# Patient Record
Sex: Male | Born: 1995 | Hispanic: Yes | Marital: Single | Smoking: Never smoker
Health system: Southern US, Community
[De-identification: ages and names within clinical notes are randomized; demographics above are authoritative.]

## PROBLEM LIST (undated history)

## (undated) DIAGNOSIS — S59222A Salter-Harris Type II physeal fracture of lower end of radius, left arm, initial encounter for closed fracture: Secondary | ICD-10-CM

## (undated) HISTORY — DX: Salter-Harris type II physeal fracture of lower end of radius, left arm, initial encounter for closed fracture: S59.222A

---
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
## 2017-02-01 ENCOUNTER — Telehealth: Payer: Self-pay | Admitting: Sports Medicine

## 2017-02-01 DIAGNOSIS — R69 Illness, unspecified: Principal | ICD-10-CM

## 2017-02-01 DIAGNOSIS — J111 Influenza due to unidentified influenza virus with other respiratory manifestations: Secondary | ICD-10-CM | POA: Insufficient documentation

## 2017-02-01 MED ORDER — THERAFLU SEVERE COLD/CGH DAY 20-10-650 MG PO PACK: PACK | ORAL | 0 refills | Status: DC

## 2017-02-01 MED ORDER — OSELTAMIVIR PHOSPHATE 75 MG PO CAPS
75.0000 mg | ORAL_CAPSULE | Freq: Two times a day (BID) | ORAL | 0 refills | Status: DC
Start: 2017-02-01 — End: 2017-04-24

## 2017-02-01 NOTE — Telephone Encounter (Signed)
Patient seen with his brother, called in medication and work note.

## 2017-04-24 ENCOUNTER — Ambulatory Visit (INDEPENDENT_AMBULATORY_CARE_PROVIDER_SITE_OTHER): Payer: Self-pay | Admitting: Physician Assistant

## 2017-04-24 VITALS — BP 128/75 | HR 82 | Temp 98.1°F | Wt 235.0 lb

## 2017-04-24 DIAGNOSIS — Z23 Encounter for immunization: Secondary | ICD-10-CM

## 2017-04-24 DIAGNOSIS — S91331A Puncture wound without foreign body, right foot, initial encounter: Secondary | ICD-10-CM

## 2017-04-24 NOTE — Progress Notes (Signed)
HPI:                                                                Norman Green is a 22 y.o. male who presents to [place]: [place] today for puncture wound  Foot Injury   The incident occurred 12 to 24 hours ago. The incident occurred in the yard. Injury mechanism: stepped on a nail while wearing a shoe. The pain is present in the right foot. The quality of the pain is described as aching. The pain is moderate. Pertinent negatives include no inability to bear weight or loss of motion. He reports no foreign bodies present. The symptoms are aggravated by weight bearing. He has tried nothing for the symptoms.       Past Medical History:  Diagnosis Date  . Closed Salter-Harris Type II physeal fracture of left distal radius 09/08/2012   No past surgical history on file. Social History  Substance Use Topics  . Smoking status: Never Smoker  . Smokeless tobacco: Never Used  . Alcohol use No   family history is not on file.  ROS: negative except as noted in the HPI  Medications: No current outpatient prescriptions on file.   No current facility-administered medications for this visit.    No Known Allergies     Objective:  BP 128/75   Pulse 82   Temp 98.1 F (36.7 C) (Oral)   Wt 235 lb (106.6 kg)   BMI 34.70 kg/m  Gen: well-groomed, cooperative, not ill-appearing, no distress Pulm: Normal work of breathing, normal phonation Neuro: alert and oriented x 3 MSK: moving all extremities, normal gait and station, no peripheral edema Skin: warm and dry, approx. 32mm wide, 87mm deep puncture wound on center of right heel, no surrounding erythema or induration, no active bleeding or drainage, no ecchymoses   No results found for this or any previous visit (from the past 72 hour(s)). No results found.    Assessment and Plan: 21 y.o. male with   1. Puncture wound of right heel, initial encounter - Tdap vaccine given - instructed to  monitor for signs of infection - Ibuprofen prn - corn cushion  Patient education and anticipatory guidance given Patient agrees with treatment plan Follow-up as needed if symptoms worsen or fail to improve  Darlyne Russian PA-C

## 2017-04-24 NOTE — Patient Instructions (Addendum)
- Ibuprofen 600mg  every 8 hours as needed for pain - Corn cushion around the puncture wound to take pressure off of it when you walk - keep area clean and dry  - monitor for signs of infection (redness, thick drainage, worsening pain)   Herida punzante (Puncture Wound) Una herida punzante es una lesin causada por un objeto fino y filoso que penetra en la piel, por ejemplo, un clavo. Este tipo de heridas no suelen dejar una gran abertura en la piel, de modo que es posible que no sangren [place]. Sin embargo, cuando se produce una herida punzante, la suciedad u otros materiales (cuerpos extraos) pueden Advertising account planner ella y Cabin crew. Esto aumenta la probabilidad de una infeccin, por ejemplo, ttanos. CAUSAS Las heridas punzantes son causadas por cualquier objeto fino y filoso que penetra en la piel, por ejemplo:  Dientes de [place], como en el caso de [place].  49 filosos y puntiagudos, como [place], [place] de vidrio, anzuelos y [place]. SNTOMAS Los sntomas de una herida punzante incluyen lo siguiente:  Social research officer, government.  Hemorragia.  Hinchazn.  Hematomas.  Prdida de lquido de la herida.  Adormecimiento, hormigueo o prdida de la funcionalidad. DIAGNSTICO Esta afeccin se diagnostica mediante la historia clnica y un examen fsico. Se examinar la herida para detectar la presencia de cuerpos extraos. Tambin se pueden hacer radiografas y otros estudios de diagnstico por imgenes. TRATAMIENTO El tratamiento de una herida punzante depende de su gravedad y, tambin, de si hay en ella cuerpos extraos. Por lo general, el tratamiento de todos los tipos de heridas punzantes comienza de la siguiente manera:  Control de la hemorragia.  Lavado de la herida con una solucin de agua y sal libre de microbios (estril).  Control de herida para detectar la presencia de cuerpos extraos. El tratamiento tambin puede incluir lo siguiente:  Abrir quirrgicamente la herida para  extraer un cuerpo extrao.  Cerrar la herida con puntos (suturas), si la hemorragia no se detiene.  Cubrir la herida con ungentos con antibitico y Furniture conservator/restorer venda (vendaje).  Aplicarse la vacuna contra el ttanos.  Aplicarse la vacuna contra la rabia. [place] o aplquese los medicamentos de venta libre y recetados solamente como se lo haya indicado el mdico.  Si le recetaron un antibitico, tmelo o aplqueselo como se lo haya indicado el mdico. No deje de usar el antibitico aunque la afeccin mejore. Cuidados de la herida  Existen muchas maneras de cerrar y [place] herida. Por ejemplo, una herida se puede cubrir con suturas, pegamento para la piel o tiras [id number].Siga las indicaciones del mdico acerca de lo siguiente:  Cmo cuidar de la herida.  Cundo y cmo cambiar el vendaje.  Cundo retirar el vendaje.  Cmo quitar lo que se haya utilizado para cerrar la herida.  Mantenga el vendaje seco, como se lo haya indicado el mdico. No tome baos de inmersin, no nade, no use el jacuzzi ni haga ninguna actividad en la que la herida quede debajo del agua hasta que el mdico lo autorice.  Limpie la herida como se lo haya indicado el mdico.  No se rasque ni se toque la herida.  Controle la herida [employer] para detectar signos de infeccin. Est atento a lo siguiente:  Dolor, hinchazn o enrojecimiento.  Lquido, sangre o pus. Instrucciones generales  Cuando est sentado o acostado, eleve la zona de la lesin por encima del nivel del corazn.  Si la herida punzante est  en el pie, pregntele al mdico si debe evitar soportar peso sobre este y durante cunto [place].  Concurra a todas las visitas de control como se lo haya indicado el mdico. Esto es importante. SOLICITE ATENCIN MDICA SI:  Le aplicaron la antitetnica y tiene hinchazn, dolor intenso, enrojecimiento o hemorragia en el sitio de la  inyeccin.  Tiene fiebre.  Se le salen las suturas.  Percibe que sale mal olor de la herida o del vendaje.  Nota que hay un cuerpo extrao en la herida, como un trozo de [place] o vidrio.  El dolor no se alivia con los [employer].  Tiene ms enrojecimiento, hinchazn o dolor en el lugar de la herida.  Observa lquido, sangre o pus que salen de la herida.  Observa que la piel cerca de la herida cambia de color.  Debe cambiar el vendaje con frecuencia debido a que hay secrecin de lquido, sangre o pus que emana de la herida.  Aparece una nueva erupcin cutnea.  Tiene adormecimiento alrededor de la herida. SOLICITE ATENCIN MDICA DE INMEDIATO SI:  Tiene mucha hinchazn alrededor de la herida.  El dolor aumenta repentinamente y es intenso.  Le aparecen bultos dolorosos en la piel.  Tiene una lnea roja que sale de la herida.  La herida est en la mano o en el pie y no puede mover correctamente uno de los dedos.  La herida est en la mano o en el pie y [employer] dedos tienen un tono plido o [place]. Esta informacin no tiene Marine scientist el consejo del mdico. Asegrese de hacerle al mdico cualquier pregunta que tenga. Document Released: 09/19/2005 Document Revised: 08/31/2015 Document Reviewed: 02/02/2015 Elsevier Interactive Patient Education  2017 [employer].

## 2017-11-22 ENCOUNTER — Encounter: Payer: Self-pay | Admitting: Sports Medicine

## 2017-11-22 ENCOUNTER — Ambulatory Visit (INDEPENDENT_AMBULATORY_CARE_PROVIDER_SITE_OTHER): Payer: Self-pay | Admitting: Sports Medicine

## 2017-11-22 DIAGNOSIS — T2012XA Burn of first degree of lip(s), initial encounter: Secondary | ICD-10-CM

## 2017-11-22 DIAGNOSIS — T2002XA Burn of unspecified degree of lip(s), initial encounter: Secondary | ICD-10-CM | POA: Insufficient documentation

## 2017-11-22 DIAGNOSIS — B001 Herpesviral vesicular dermatitis: Secondary | ICD-10-CM

## 2017-11-22 MED ORDER — VALACYCLOVIR HCL 1 G PO TABS: 1000.0000 mg | ORAL_TABLET | Freq: Two times a day (BID) | ORAL | 2 refills | Status: DC

## 2017-11-22 MED ORDER — IBUPROFEN 800 MG PO TABS: 800.0000 mg | ORAL_TABLET | Freq: Three times a day (TID) | ORAL | 2 refills | Status: DC | PRN

## 2017-11-22 MED ORDER — MUPIROCIN 2 % EX OINT: TOPICAL_OINTMENT | CUTANEOUS | 3 refills | Status: DC

## 2017-11-22 NOTE — Assessment & Plan Note (Signed)
Adding Valtrex.

## 2017-11-22 NOTE — Progress Notes (Signed)
  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  He also then developed a cold sore in his bottom lip adjacent from the blister.  Symptoms are moderate, improving.  Localized without radiation.  Past medical history:  Negative.  See flowsheet/record as well for more information.  Surgical history: Negative.  See flowsheet/record as well for more information.  Family history: Negative.  See flowsheet/record as well for more information.  Social history: Negative.  See flowsheet/record as well for more information.  Allergies, and medications have been entered into the medical record, reviewed, and no changes needed.   Review of Systems: No fevers, chills, night sweats, weight loss, chest pain, or shortness of breath.   Objective:    General: Well Developed, well nourished, and in no acute distress.  Neuro: Alert and oriented x3, extra-ocular muscles intact, sensation grossly intact.  HEENT: Normocephalic, atraumatic, pupils equal round reactive to light, neck supple, no masses, no lymphadenopathy, thyroid nonpalpable.  Oropharynx, nasopharynx, ear canals unremarkable, lips are swollen, upper lip worse than the bottom lip, there is evidence of a superficial partial-thickness burn on the left side of his upper lip, no surrounding erythema, induration, no drainage.  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 Skin: Warm and dry, no rashes. Cardiac: Regular rate and rhythm, no murmurs rubs or gallops, no lower extremity edema.  Respiratory: Clear to auscultation bilaterally. Not using accessory muscles, speaking in full sentences.  Impression and Recommendations:    Cold sore Adding Valtrex.  Burn of lip Patient burned his lip about a week ago, he then  had a low-grade bacterial superinfection that seems to be resolving, adding topical mupirocin, ibuprofen 800. Return if no better in a week. ___________________________________________ Gwen Her. Dianah Field, M.D., ABFM., CAQSM. Primary Care and [place] Instructor of [place] of [facility] of Medicine

## 2017-11-22 NOTE — Assessment & Plan Note (Signed)
Patient burned his lip about a week ago, he then had a low-grade bacterial superinfection that seems to be resolving, adding topical mupirocin, ibuprofen 800. Return if no better in a week.

## 2019-01-27 ENCOUNTER — Ambulatory Visit (INDEPENDENT_AMBULATORY_CARE_PROVIDER_SITE_OTHER): Payer: Self-pay | Admitting: Physician Assistant

## 2019-01-27 ENCOUNTER — Encounter: Payer: Self-pay | Admitting: Physician Assistant

## 2019-01-27 VITALS — BP 142/85 | HR 74 | Temp 98.1°F | Wt 257.0 lb

## 2019-01-27 DIAGNOSIS — R030 Elevated blood-pressure reading, without diagnosis of hypertension: Secondary | ICD-10-CM | POA: Insufficient documentation

## 2019-01-27 DIAGNOSIS — D171 Benign lipomatous neoplasm of skin and subcutaneous tissue of trunk: Secondary | ICD-10-CM | POA: Insufficient documentation

## 2019-01-27 NOTE — Progress Notes (Signed)
HPI:                                                                Ioannis Schuh is a 23 y.o. male who presents to [facility]: [facility] today for abdominal wall mass  Patient reports 2 discrete, firm masses of his right lower abdominal area.  He noticed these approximately 3 weeks ago.  He states that he noticed them because while laying on his stomach he had some mild pain.  Masses are otherwise asymptomatic.  Denies any rashes or skin changes.  No fevers, chills, malaise.   Past Medical History:  Diagnosis Date  . Closed Salter-Harris Type II physeal fracture of left distal radius 09/08/2012   No past surgical history on file. Social History   Tobacco Use  . Smoking status: Never Smoker  . Smokeless tobacco: Never Used  Substance Use Topics  . Alcohol use: No   family history is not on file.    ROS: negative except as noted in the HPI  Medications: Current Outpatient Medications  Medication Sig Dispense Refill  . valACYclovir (VALTREX) 1000 MG tablet Take 1 tablet (1,000 mg total) by mouth 2 (two) times daily. 14 tablet 2   No current facility-administered medications for this visit.    No Known Allergies     Objective:  BP (!) 142/85   Pulse 74   Temp 98.1 F (36.7 C) (Oral)   Wt 257 lb (116.6 kg)   BMI 37.95 kg/m  Gen:  alert, not ill-appearing, no distress, appropriate for age Skin: 2 discrete, firm, subcutaneous nodules, measuring less than 1 cm of right lower abdominal wall   No results found for this or any previous visit (from the past 72 hour(s)). No results found.  BP Readings from Last 3 Encounters:  01/27/19 (!) 142/85  11/22/17 130/78  04/24/17 128/75     Assessment and Plan: 23 y.o. male with   .Lonza was seen today for cyst.  Diagnoses and all orders for this visit:  Lipoma of abdominal wall  Abdominal wall mass Patient counseled on benign nature of lipoma Since these are symptomatic  recommended surgical excision He will be scheduled for excision with Dr. Dianah Field  Elevated blood pressure reading Blood pressure is typically in range in office Active surveillance  Patient education and anticipatory guidance given Patient agrees with treatment plan Follow-up as needed if symptoms worsen or fail to improve  Darlyne Russian PA-C

## 2019-01-27 NOTE — Patient Instructions (Signed)
Lipoma    A lipoma is a noncancerous (benign) tumor that is made up of fat cells. This is a very common type of soft-tissue growth. Lipomas are usually found under the skin (subcutaneous). They may occur in any tissue of the body that contains fat. Common areas for lipomas to appear include the back, shoulders, buttocks, and thighs.   Lipomas grow slowly, and they are usually painless. Most lipomas do not cause problems and do not require treatment.  What are the causes?  The cause of this condition is not known.  What increases the risk?  You are more likely to develop this condition if:   You are 40-60 years old.   You have a family history of lipomas.  What are the signs or symptoms?  A lipoma usually appears as a small, round bump under the skin. In most cases, the lump will:   Feel soft or rubbery.   Not cause pain or other symptoms.  However, if a lipoma is located in an area where it pushes on nerves, it can become painful or cause other symptoms.  How is this diagnosed?  A lipoma can usually be diagnosed with a physical exam. You may also have tests to confirm the diagnosis and to rule out other conditions. Tests may include:   Imaging tests, such as a CT scan or MRI.   Removal of a tissue sample to be looked at under a microscope (biopsy).  How is this treated?  Treatment for this condition depends on the size of the lipoma and whether it is causing any symptoms.   For small lipomas that are not causing problems, no treatment is needed.   If a lipoma is bigger or it causes problems, surgery may be done to remove the lipoma. Lipomas can also be removed to improve appearance. Most often, the procedure is done after applying a medicine that numbs the area (local anesthetic).  Follow these instructions at home:   Watch your lipoma for any changes.   Keep all follow-up visits as told by your health care provider. This is important.  Contact a health care provider if:   Your lipoma becomes larger or  hard.   Your lipoma becomes painful, red, or increasingly swollen. These could be signs of infection or a more serious condition.  Get help right away if:   You develop tingling or numbness in an area near the lipoma. This could indicate that the lipoma is causing nerve damage.  Summary   A lipoma is a noncancerous tumor that is made up of fat cells.   Most lipomas do not cause problems and do not require treatment.   If a lipoma is bigger or it causes problems, surgery may be done to remove the lipoma.  This information is not intended to replace advice given to you by your health care provider. Make sure you discuss any questions you have with your health care provider.  Document Released: 11/30/2002 Document Revised: 11/26/2017 Document Reviewed: 11/26/2017  Elsevier Interactive Patient Education  2019 Elsevier Inc.

## 2019-02-02 ENCOUNTER — Ambulatory Visit (INDEPENDENT_AMBULATORY_CARE_PROVIDER_SITE_OTHER): Payer: Self-pay | Admitting: Sports Medicine

## 2019-02-02 ENCOUNTER — Encounter: Payer: Self-pay | Admitting: Sports Medicine

## 2019-02-02 DIAGNOSIS — D171 Benign lipomatous neoplasm of skin and subcutaneous tissue of trunk: Secondary | ICD-10-CM

## 2019-02-02 NOTE — Progress Notes (Signed)
   Procedure:  Excision of 1 cm deep to subcutaneous skin tumor Risks, benefits, and alternatives explained and consent obtained. Time out conducted. Surface prepped with alcohol. 10cc lidocaine with epinephine infiltrated in a field block. Adequate anesthesia ensured. Area prepped and draped in a sterile fashion. Excision performed with: Using a #15 blade I made a longitudinal incision over the lesion, I carried the dissection down to the deep subcutaneous tissues and removed the lesion which felt to be a firm lipoma.  I then closed the incision with with #2 deep dermal 3-0 Vicryl sutures and then a running subcuticular 3-0 Vicryl suture Hemostasis achieved. Pt stable.

## 2019-02-02 NOTE — Assessment & Plan Note (Signed)
Excision of 1 cm deep subcutaneous lipoma. Return in 2 weeks for an incision check.

## 2019-02-02 NOTE — Patient Instructions (Signed)
Incision Care, Adult °An incision is a surgical cut that is made through your skin. Most incisions are closed after surgery. Your incision may be closed with stitches (sutures), staples, skin glue, or adhesive strips. You may need to return to your health care provider to have sutures or staples removed. This may occur several days to several weeks after your surgery. The incision needs to be cared for properly to prevent infection. °How to care for your incision °Incision care ° °· Follow instructions from your health care provider about how to take care of your incision. Make sure you: °? Wash your hands with soap and water before you change the bandage (dressing). If soap and water are not available, use hand sanitizer. °? Change your dressing as told by your health care provider. °? Leave sutures, skin glue, or adhesive strips in place. These skin closures may need to stay in place for 2 weeks or longer. If adhesive strip edges start to loosen and curl up, you may trim the loose edges. Do not remove adhesive strips completely unless your health care provider tells you to do that. °· Check your incision area every day for signs of infection. Check for: °? More redness, swelling, or pain. °? More fluid or blood. °? Warmth. °? Pus or a bad smell. °· Ask your health care provider how to clean the incision. This may include: °? Using mild soap and water. °? Using a clean towel to pat the incision dry after cleaning it. °? Applying a cream or ointment. Do this only as told by your health care provider. °? Covering the incision with a clean dressing. °· Ask your health care provider when you can leave the incision uncovered. °· Do not take baths, swim, or use a hot tub until your health care provider approves. Ask your health care provider if you can take showers. You may only be allowed to take sponge baths for bathing. °Medicines °· If you were prescribed an antibiotic medicine, cream, or ointment, take or apply the  antibiotic as told by your health care provider. Do not stop taking or applying the antibiotic even if your condition improves. °· Take over-the-counter and prescription medicines only as told by your health care provider. °General instructions °· Limit movement around your incision to improve healing. °? Avoid straining, lifting, or exercise for the first month, or for as long as told by your health care provider. °? Follow instructions from your health care provider about returning to your normal activities. °? Ask your health care provider what activities are safe. °· Protect your incision from the sun when you are outside for the first 6 months, or for as long as told by your health care provider. Apply sunscreen around the scar or cover it up. °· Keep all follow-up visits as told by your health care provider. This is important. °Contact a health care provider if: °· Your have more redness, swelling, or pain around the incision. °· You have more fluid or blood coming from the incision. °· Your incision feels warm to the touch. °· You have pus or a bad smell coming from the incision. °· You have a fever or shaking chills. °· You are nauseous or you vomit. °· You are dizzy. °· Your sutures or staples come undone. °Get help right away if: °· You have a red streak coming from your incision. °· Your incision bleeds through the dressing and the bleeding does not stop with gentle pressure. °· The edges of   your incision open up and separate. °· You have severe pain. °· You have a rash. °· You are confused. °· You faint. °· You have trouble breathing and a fast heartbeat. °This information is not intended to replace advice given to you by your health care provider. Make sure you discuss any questions you have with your health care provider. °Document Released: 06/29/2005 Document Revised: 08/17/2016 Document Reviewed: 06/27/2016 °Elsevier Interactive Patient Education © 2019 Elsevier Inc. ° °

## 2019-02-03 ENCOUNTER — Telehealth: Payer: Self-pay

## 2019-02-03 MED ORDER — IBUPROFEN 800 MG PO TABS: 800.0000 mg | ORAL_TABLET | Freq: Three times a day (TID) | ORAL | 2 refills | Status: DC | PRN

## 2019-02-03 NOTE — Telephone Encounter (Signed)
Left message advising of recommendations.  

## 2019-02-03 NOTE — Telephone Encounter (Signed)
Have him do ibuprofen 800 mg 3 times per day.  I am going to call this in.

## 2019-02-03 NOTE — Telephone Encounter (Signed)
Norman Green called and states he is in pain. He has tried Tylenol without relief. He would like Dr Dianah Field to send in pain medications.

## 2019-02-10 ENCOUNTER — Telehealth: Payer: Self-pay | Admitting: Sports Medicine

## 2019-02-10 NOTE — Telephone Encounter (Signed)
Left VM about paperwork to be picked up.   Pepsi Shot Term

## 2019-02-17 ENCOUNTER — Encounter: Payer: Self-pay | Admitting: Sports Medicine

## 2019-02-17 ENCOUNTER — Ambulatory Visit (INDEPENDENT_AMBULATORY_CARE_PROVIDER_SITE_OTHER): Payer: PRIVATE HEALTH INSURANCE | Admitting: Sports Medicine

## 2019-02-17 DIAGNOSIS — D171 Benign lipomatous neoplasm of skin and subcutaneous tissue of trunk: Secondary | ICD-10-CM

## 2019-02-17 NOTE — Assessment & Plan Note (Signed)
Incision looks fantastic 2 weeks postop. He will massage the incision to soften scar tissue, all pain is now gone. Pain from the lipoma is now gone. Return as needed.

## 2019-02-17 NOTE — Progress Notes (Signed)
  Subjective: 2 weeks post surgical excision of a right lower abdominal lipoma, doing well.   Objective: General: Well-developed, well-nourished, and in no acute distress. Incision: Clean, dry, intact.  Assessment/plan:   Lipoma of abdominal wall Incision looks fantastic 2 weeks postop. He will massage the incision to soften scar tissue, all pain is now gone. Pain from the lipoma is now gone. Return as needed. ___________________________________________ Gwen Her. Dianah Field, M.D., ABFM., CAQSM. Primary Care and [place] Instructor of [place] of [facility] of Medicine

## 2020-04-13 ENCOUNTER — Other Ambulatory Visit: Payer: Self-pay

## 2020-04-13 ENCOUNTER — Encounter: Payer: Self-pay | Admitting: Family Medicine

## 2020-04-13 ENCOUNTER — Ambulatory Visit (INDEPENDENT_AMBULATORY_CARE_PROVIDER_SITE_OTHER): Payer: PRIVATE HEALTH INSURANCE | Admitting: Family Medicine

## 2020-04-13 VITALS — BP 147/92 | HR 83 | Temp 98.2°F | Ht 69.0 in | Wt 240.0 lb

## 2020-04-13 DIAGNOSIS — R109 Unspecified abdominal pain: Secondary | ICD-10-CM

## 2020-04-13 DIAGNOSIS — K591 Functional diarrhea: Secondary | ICD-10-CM | POA: Diagnosis not present

## 2020-04-13 LAB — COMPLETE METABOLIC PANEL WITH GFR
BUN: 13 mg/dL (ref 7–25)
GFR, Est African American: 143 mL/min/{1.73_m2} (ref >=60)
Globulin: 3 g/dL (calc) (ref 1.9–3.7)
Total Bilirubin: 0.5 mg/dL (ref 0.2–1.2)
Total Protein: 7.6 g/dL (ref 6.1–8.1)

## 2020-04-13 NOTE — Patient Instructions (Signed)
Nice to meet you today! Try dietary changes including increasing fiber intake Keep a journal of what you eat and symptoms you experience.     Irritable Bowel Syndrome, Adult  Irritable bowel syndrome (IBS) is a group of symptoms that affects the organs responsible for digestion (gastrointestinal or GI tract). IBS is not one specific disease. To regulate how the GI tract works, the body sends signals back and forth between the intestines and the brain. If you have IBS, there may be a problem with these signals. As a result, the GI tract does not function normally. The intestines may become more sensitive and overreact to certain things. This may be especially true when you eat certain foods or when you are under stress. There are four types of IBS. These may be determined based on the consistency of your stool (feces):  IBS with diarrhea.  IBS with constipation.  Mixed IBS.  Unsubtyped IBS. It is important to know which type of IBS you have. Certain treatments are more likely to be helpful for certain types of IBS. What are the causes? The exact cause of IBS is not known. What increases the risk? You may have a higher risk for IBS if you:  Are male.  Are younger than 92.  Have a family history of IBS.  Have a mental health condition, such as depression, anxiety, or post-traumatic stress disorder.  Have had a bacterial infection of your GI tract. What are the signs or symptoms? Symptoms of IBS vary from person to person. The main symptom is abdominal pain or discomfort. Other symptoms usually include one or more of the following:  Diarrhea, constipation, or both.  Abdominal swelling or bloating.  Feeling full after eating a small or regular-sized meal.  Frequent gas.  Mucus in the stool.  A feeling of having more stool left after a bowel movement. Symptoms tend to come and go. They may be triggered by stress, mental health conditions, or certain foods. How is this  diagnosed? This condition may be diagnosed based on a physical exam, your medical history, and your symptoms. You may have tests, such as:  Blood tests.  Stool test.  X-rays.  CT scan.  Colonoscopy. This is a procedure in which your GI tract is viewed with a long, thin, flexible tube. How is this treated? There is no cure for IBS, but treatment can help relieve symptoms. Treatment depends on the type of IBS you have, and may include:  Changes to your diet, such as: ? Avoiding foods that cause symptoms. ? Drinking more water. ? Following a low-FODMAP (fermentable oligosaccharides, disaccharides, monosaccharides, and polyols) diet for up to 6 weeks, or as told by your health care provider. FODMAPs are sugars that are hard for some people to digest. ? Eating more fiber. ? Eating medium-sized meals at the same times every day.  Medicines. These may include: ? Fiber supplements, if you have constipation. ? Medicine to control diarrhea (antidiarrheal medicines). ? Medicine to help control muscle tightening (spasms) in your GI tract (antispasmodic medicines). ? Medicines to help with mental health conditions, such as antidepressants or tranquilizers.  Talk therapy or counseling.  Working with a diet and nutrition specialist (dietitian) to help create a food plan that is right for you.  Managing your stress. Follow these instructions at home: Eating and drinking  Eat a healthy diet.  Eat medium-sized meals at about the same time every day. Do not eat large meals.  Gradually eat more fiber-rich foods. These  include whole grains, fruits, and vegetables. This may be especially helpful if you have IBS with constipation.  Eat a diet low in FODMAPs.  Drink enough fluid to keep your urine pale yellow.  Keep a journal of foods that seem to trigger symptoms.  Avoid foods and drinks that: ? Contain added sugar. ? Make your symptoms worse. Dairy products, caffeinated drinks, and  carbonated drinks can make symptoms worse for some people. General instructions  Take over-the-counter and prescription medicines and supplements only as told by your health care provider.  Get enough exercise. Do at least 150 minutes of moderate-intensity exercise each week.  Manage your stress. Getting enough sleep and exercise can help you manage stress.  Keep all follow-up visits as told by your health care provider and therapist. This is important. Alcohol Use  Do not drink alcohol if: ? Your health care provider tells you not to drink. ? You are pregnant, may be pregnant, or are planning to become pregnant.  If you drink alcohol, limit how much you have: ? 0-1 drink a day for women. ? 0-2 drinks a day for men.  Be aware of how much alcohol is in your drink. In the [place] one drink equals one typical bottle of beer (12 oz), one-half glass of wine (5 oz), or one shot of hard liquor (1 oz). Contact a health care provider if you have:  Constant pain.  Weight loss.  Difficulty or pain when swallowing.  Diarrhea that gets worse. Get help right away if you have:  Severe abdominal pain.  Fever.  Diarrhea with symptoms of dehydration, such as dizziness or dry mouth.  Bright red blood in your stool.  Stool that is black and tarry.  Abdominal swelling.  Vomiting that does not stop.  Blood in your vomit. Summary  Irritable bowel syndrome (IBS) is not one specific disease. It is a group of symptoms that affects digestion.  Your intestines may become more sensitive and overreact to certain things. This may be especially true when you eat certain foods or when you are under stress.  There is no cure for IBS, but treatment can help relieve symptoms. This information is not intended to replace advice given to you by your health care provider. Make sure you discuss any questions you have with your health care provider. Document Revised: 12/03/2017 Document Reviewed:  12/03/2017 Elsevier Patient Education  2020 [employer]  FODMAPs (fermentable oligosaccharides, disaccharides, monosaccharides, and polyols) are sugars that are hard for some people to digest. A low-FODMAP eating plan may help some people who have bowel (intestinal) diseases to manage their symptoms. This meal plan can be complicated to follow. Work with a diet and nutrition specialist (dietitian) to make a low-FODMAP eating plan that is right for you. A dietitian can make sure that you get enough nutrition from this diet. What are tips for following this plan? Reading food labels  Check labels for hidden FODMAPs such as: ? High-fructose syrup. ? Honey. ? Agave. ? Natural fruit flavors. ? Onion or garlic powder.  Choose low-FODMAP foods that contain 3-4 grams of fiber per serving.  Check food labels for serving sizes. Eat only one serving at a time to make sure FODMAP levels stay low. Meal planning  Follow a low-FODMAP eating plan for up to 6 weeks, or as told by your health care provider or dietitian.  To follow the eating plan: 1. Eliminate high-FODMAP foods from your diet completely. 2. Gradually  reintroduce high-FODMAP foods into your diet one at a time. Most people should wait a few days after introducing one high-FODMAP food before they introduce the next high-FODMAP food. Your dietitian can recommend how quickly you may reintroduce foods. 3. Keep a daily record of what you eat and drink, and make note of any symptoms that you have after eating. 4. Review your daily record with a dietitian regularly. Your dietitian can help you identify which foods you can eat and which foods you should avoid. General tips  Drink enough fluid each day to keep your urine pale yellow.  Avoid processed foods. These often have added sugar and may be high in FODMAPs.  Avoid most dairy products, whole grains, and sweeteners.  Work with a dietitian to make sure you  get enough fiber in your diet. Recommended foods Grains  Gluten-free grains, such as rice, oats, buckwheat, quinoa, corn, polenta, and millet. Gluten-free pasta, bread, or cereal. Rice noodles. Corn tortillas. Vegetables  Eggplant, zucchini, cucumber, peppers, green beans, Brussels sprouts, bean sprouts, lettuce, arugula, kale, Swiss chard, spinach, collard greens, bok choy, summer squash, potato, and tomato. Limited amounts of corn, carrot, and sweet potato. Green parts of scallions. Fruits  Bananas, oranges, lemons, limes, blueberries, raspberries, strawberries, grapes, cantaloupe, honeydew melon, kiwi, papaya, passion fruit, and pineapple. Limited amounts of dried cranberries, banana chips, and shredded coconut. Dairy  Lactose-free milk, yogurt, and kefir. Lactose-free cottage cheese and ice cream. Non-dairy milks, such as almond, coconut, hemp, and rice milk. Yogurts made of non-dairy milks. Limited amounts of goat cheese, brie, mozzarella, parmesan, swiss, and other hard cheeses. Meats and other protein foods  Unseasoned beef, pork, poultry, or fish. Eggs. Berniece Salines. Tofu (firm) and tempeh. Limited amounts of nuts and seeds, such as almonds, walnuts, [place] nuts, pecans, peanuts, pumpkin seeds, chia seeds, and sunflower seeds. Fats and oils  Butter-free spreads. Vegetable oils, such as olive, canola, and sunflower oil. Seasoning and other foods  Artificial sweeteners with names that do not end in "ol" such as aspartame, saccharine, and stevia. Maple syrup, white table sugar, raw sugar, brown sugar, and molasses. Fresh basil, coriander, parsley, rosemary, and thyme. Beverages  Water and mineral water. Sugar-sweetened soft drinks. Small amounts of orange juice or cranberry juice. Black and green tea. Most dry wines. Coffee. This may not be a complete list of low-FODMAP foods. Talk with your dietitian for more information. Foods to avoid Grains  Wheat, including kamut, durum, and  semolina. Barley and bulgur. Couscous. Wheat-based cereals. Wheat noodles, bread, crackers, and pastries. Vegetables  Chicory root, artichoke, asparagus, cabbage, snow peas, sugar snap peas, mushrooms, and cauliflower. Onions, garlic, leeks, and the white part of scallions. Fruits  Fresh, dried, and juiced forms of apple, pear, watermelon, peach, plum, cherries, apricots, blackberries, boysenberries, figs, nectarines, and mango. Avocado. Dairy  Milk, yogurt, ice cream, and soft cheese. Cream and sour cream. Milk-based sauces. Custard. Meats and other protein foods  Fried or fatty meat. Sausage. Cashews and pistachios. Soybeans, baked beans, black beans, chickpeas, kidney beans, fava beans, navy beans, lentils, and split peas. Seasoning and other foods  Any sugar-free gum or candy. Foods that contain artificial sweeteners such as sorbitol, mannitol, isomalt, or xylitol. Foods that contain honey, high-fructose corn syrup, or agave. Bouillon, vegetable stock, beef stock, and chicken stock. Garlic and onion powder. Condiments made with onion, such as hummus, chutney, pickles, relish, salad dressing, and salsa. Tomato paste. Beverages  Chicory-based drinks. Coffee substitutes. Chamomile tea. Fennel tea. Sweet or fortified wines such  as port or sherry. Diet soft drinks made with isomalt, mannitol, maltitol, sorbitol, or xylitol. Apple, pear, and mango juice. Juices with high-fructose corn syrup. This may not be a complete list of high-FODMAP foods. Talk with your dietitian to discuss what dietary choices are best for you.  Summary  A low-FODMAP eating plan is a short-term diet that eliminates FODMAPs from your diet to help ease symptoms of certain bowel diseases.  The eating plan usually lasts up to 6 weeks. After that, high-FODMAP foods are restarted gradually, one at a time, so you can find out which may be causing symptoms.  A low-FODMAP eating plan can be complicated. It is best to work with  a dietitian who has experience with this type of plan. This information is not intended to replace advice given to you by your health care provider. Make sure you discuss any questions you have with your health care provider. Document Revised: 11/22/2017 Document Reviewed: 08/06/2017 Elsevier Patient Education  [employer].

## 2020-04-14 LAB — COMPLETE METABOLIC PANEL WITH GFR
AG Ratio: 1.5 (calc) (ref 1.0–2.5)
ALT: 16 U/L (ref 9–46)
AST: 18 U/L (ref 10–40)
Albumin: 4.6 g/dL (ref 3.6–5.1)
Alkaline phosphatase (APISO): 75 U/L (ref 36–130)
CO2: 27 mmol/L (ref 20–32)
Calcium: 10.3 mg/dL (ref 8.6–10.3)
Chloride: 104 mmol/L (ref 98–110)
Creat: 0.86 mg/dL (ref 0.60–1.35)
GFR, Est Non African American: 123 mL/min/{1.73_m2} (ref >=60)
Glucose, Bld: 105 mg/dL — ABNORMAL HIGH (ref 65–99)
Potassium: 4.1 mmol/L (ref 3.5–5.3)
Sodium: 140 mmol/L (ref 135–146)

## 2020-04-14 LAB — SEDIMENTATION RATE: Sed Rate: 19 mm/h — ABNORMAL HIGH (ref 0–15)

## 2020-04-14 LAB — C-REACTIVE PROTEIN: CRP: 5.5 mg/L (ref <8.0)

## 2020-04-17 ENCOUNTER — Encounter: Payer: Self-pay | Admitting: Family Medicine

## 2020-04-17 DIAGNOSIS — K591 Functional diarrhea: Secondary | ICD-10-CM | POA: Insufficient documentation

## 2020-04-17 NOTE — Assessment & Plan Note (Signed)
Current symptoms seem more consistent with IBS rather than IBD.  He does report some weight loss, however no previous weight on file.   Recommend keeping food diary. Discussed low FODMAP diet Will check inflammatory markers as well.  Discussed red flag symptoms.

## 2020-04-17 NOTE — Progress Notes (Signed)
Norman Green - 24 y.o. male MRN [id number]  Date of birth: [birth year]  Subjective Chief Complaint  Patient presents with  . Abdominal Pain    HPI Norman Green is a 24 y.o. male here today with complaint of GI problems.  He reports he has had intermittent episodes of diarrhea and constipation, mostly favoring diarrhea.  He has had associated abdominal cramping.  He reports that he has had some weight loss. He denies blood in stool, nausea or vomiting.  He has not noticed if any certain foods or stress make this worse. He is concerned about inflammatory bowel disease  ROS:  A comprehensive ROS was completed and negative except as noted per HPI  No Known Allergies  Past Medical History:  Diagnosis Date  . Closed Salter-Harris Type II physeal fracture of left distal radius 09/08/2012    No past surgical history on file.  Social History   Socioeconomic History  . Marital status: Single    Spouse name: Not on file  . Number of children: Not on file  . Years of education: Not on file  . Highest education level: Not on file  Occupational History  . Not on file  Tobacco Use  . Smoking status: Never Smoker  . Smokeless tobacco: Never Used  Substance and Sexual Activity  . Alcohol use: No  . Drug use: No  . Sexual activity: Never  Other Topics Concern  . Not on file  Social History Narrative  . Not on file   Social Determinants of Health   Financial Resource Strain:   . Difficulty of Paying Living Expenses:   Food Insecurity:   . Worried About Charity fundraiser in the Last Year:   . Arboriculturist in the Last Year:   Transportation Needs:   . Film/video editor (Medical):   Marland Kitchen Lack of Transportation (Non-Medical):   Physical Activity:   . Days of Exercise per Week:   . Minutes of Exercise per Session:   Stress:   . Feeling of Stress :   Social Connections:   . Frequency of Communication with Friends and Family:   . Frequency of Social Gatherings with Friends  and Family:   . Attends Religious Services:   . Active Member of Clubs or Organizations:   . Attends Archivist Meetings:   Marland Kitchen Marital Status:     No family history on file.  Health Maintenance  Topic Date Due  . COVID-19 Vaccine (1) Never done  . INFLUENZA VACCINE  07/24/2020  . TETANUS/TDAP  04/25/2027  . HIV Screening  Completed     ----------------------------------------------------------------------------------------------------------------------------------------------------------------------------------------------------------------- Physical Exam BP (!) 147/92   Pulse 83   Temp 98.2 F (36.8 C) (Oral)   Ht 5\' 9"  (1.753 m)   Wt 240 lb (108.9 kg)   BMI 35.44 kg/m   Physical Exam Constitutional:      Appearance: He is well-developed.  HENT:     Head: Normocephalic and atraumatic.  Eyes:     General: No scleral icterus. Cardiovascular:     Rate and Rhythm: Normal rate and regular rhythm.  Pulmonary:     Effort: Pulmonary effort is normal.     Breath sounds: Normal breath sounds.  Abdominal:     General: There is no distension.     Palpations: Abdomen is soft.     Tenderness: There is no abdominal tenderness.  Musculoskeletal:     Cervical back: Neck supple.  Skin:    General: Skin  is warm and dry.  Neurological:     General: No focal deficit present.     Mental Status: He is alert.  Psychiatric:        Mood and Affect: Mood normal.        Behavior: Behavior normal.     ------------------------------------------------------------------------------------------------------------------------------------------------------------------------------------------------------------------- Assessment and Plan  Functional diarrhea Current symptoms seem more consistent with IBS rather than IBD.  He does report some weight loss, however no previous weight on file.   Recommend keeping food diary. Discussed low FODMAP diet Will check inflammatory markers  as well.  Discussed red flag symptoms.     No orders of the defined types were placed in this encounter.   No follow-ups on file.    This visit occurred during the SARS-CoV-2 public health emergency.  Safety protocols were in place, including screening questions prior to the visit, additional usage of staff PPE, and extensive cleaning of exam room while observing appropriate contact time as indicated for disinfecting solutions.

## 2020-04-18 ENCOUNTER — Other Ambulatory Visit: Payer: Self-pay

## 2020-04-18 ENCOUNTER — Encounter: Payer: Self-pay | Admitting: Family Medicine

## 2020-04-18 ENCOUNTER — Ambulatory Visit (INDEPENDENT_AMBULATORY_CARE_PROVIDER_SITE_OTHER): Payer: PRIVATE HEALTH INSURANCE | Admitting: Family Medicine

## 2020-04-18 DIAGNOSIS — M7580 Other shoulder lesions, unspecified shoulder: Secondary | ICD-10-CM | POA: Insufficient documentation

## 2020-04-18 DIAGNOSIS — M7582 Other shoulder lesions, left shoulder: Secondary | ICD-10-CM | POA: Diagnosis not present

## 2020-04-18 MED ORDER — MELOXICAM 7.5 MG PO TABS
7.5000 mg | ORAL_TABLET | Freq: Every day | ORAL | 0 refills | Status: DC
Start: 2020-04-18 — End: 2023-07-26

## 2020-04-18 NOTE — Progress Notes (Signed)
Norman Green - 24 y.o. male MRN [id number]  Date of birth: 30-Jul-1996  Subjective Chief Complaint  Patient presents with  . Shoulder Pain    HPI Norman Green is a 24 y.o. male here today with complaint of L shoulder pain.  Reports that he was playing frisbee last week and noticed popping sensation in L shoulder with pain.  It has improved some since initial injury but is still having some pain.  He is anxious about trying to lift his arm due to popping sensation.  He denies radiation of pain, numbness or tingling.    ROS:  A comprehensive ROS was completed and negative except as noted per HPI  No Known Allergies  Past Medical History:  Diagnosis Date  . Closed Salter-Harris Type II physeal fracture of left distal radius 09/08/2012    History reviewed. No pertinent surgical history.  Social History   Socioeconomic History  . Marital status: Single    Spouse name: Not on file  . Number of children: Not on file  . Years of education: Not on file  . Highest education level: Not on file  Occupational History  . Not on file  Tobacco Use  . Smoking status: Never Smoker  . Smokeless tobacco: Never Used  Substance and Sexual Activity  . Alcohol use: No  . Drug use: No  . Sexual activity: Never  Other Topics Concern  . Not on file  Social History Narrative  . Not on file   Social Determinants of Health   Financial Resource Strain:   . Difficulty of Paying Living Expenses:   Food Insecurity:   . Worried About Charity fundraiser in the Last Year:   . Arboriculturist in the Last Year:   Transportation Needs:   . Film/video editor (Medical):   Marland Kitchen Lack of Transportation (Non-Medical):   Physical Activity:   . Days of Exercise per Week:   . Minutes of Exercise per Session:   Stress:   . Feeling of Stress :   Social Connections:   . Frequency of Communication with Friends and Family:   . Frequency of Social Gatherings with Friends and Family:   . Attends Religious  Services:   . Active Member of Clubs or Organizations:   . Attends Archivist Meetings:   Marland Kitchen Marital Status:     History reviewed. No pertinent family history.  Health Maintenance  Topic Date Due  . COVID-19 Vaccine (1) 05/04/2020 (Originally 03/29/2014)  . INFLUENZA VACCINE  07/24/2020  . TETANUS/TDAP  04/25/2027  . HIV Screening  Completed     ----------------------------------------------------------------------------------------------------------------------------------------------------------------------------------------------------------------- Physical Exam BP (!) 148/88   Pulse 83   Temp 98.1 F (36.7 C) (Oral)   Ht 5\' 9"  (1.753 m)   Wt 239 lb (108.4 kg)   BMI 35.29 kg/m   Physical Exam Constitutional:      Appearance: Normal appearance.  Cardiovascular:     Rate and Rhythm: Regular rhythm.  Musculoskeletal:     Cervical back: Neck supple.     Comments: L shoulder normal to inspection. Negative sulcus sign.   Mild pain in bicipital groove.   AROM is limited in abduction and flexion due to pain.  He has normal PROM without pain. Rotator cuff muscles with good strength Negative impingement testing.      Skin:    General: Skin is warm and dry.  Neurological:     General: No focal deficit present.     Mental Status:  He is alert.     ------------------------------------------------------------------------------------------------------------------------------------------------------------------------------------------------------------------- Assessment and Plan  Rotator cuff tendinitis Recommend relative rest  Start meloxicam 7.5mg  daily as needed.  Ice after activity.  Call or follow up if not improving over the next couple of weeks.    Meds ordered this encounter  Medications  . meloxicam (MOBIC) 7.5 MG tablet    Sig: Take 1 tablet (7.5 mg total) by mouth daily.    Dispense:  30 tablet    Refill:  0    No follow-ups on file.    This  visit occurred during the SARS-CoV-2 public health emergency.  Safety protocols were in place, including screening questions prior to the visit, additional usage of staff PPE, and extensive cleaning of exam room while observing appropriate contact time as indicated for disinfecting solutions.

## 2020-04-18 NOTE — Assessment & Plan Note (Signed)
Recommend relative rest  Start meloxicam 7.5mg  daily as needed.  Ice after activity.  Call or follow up if not improving over the next couple of weeks.

## 2020-04-18 NOTE — Patient Instructions (Signed)
Tendinitis ° °Tendinitis is swelling (inflammation) of a tendon. A tendon is a cord of tissue that connects muscle to bone. Tendinitis can cause pain, tenderness, and swelling. °What are the causes? °· Using a tendon or muscle too much (overuse). This is a common cause. °· Wear and tear that happens as you age. °· Injury. °· Some medical conditions, such as arthritis. °· Some medicines. °What increases the risk? °You are more likely to get this condition if you do activities that involve the same movements over and over again (repetitive motions). °What are the signs or symptoms? °· Pain. °· Tenderness. °· Mild swelling. °· Decreased range of motion. °How is this treated? °This condition is usually treated with RICE therapy. RICE stands for: °· Rest. °· Ice. °· Compression. This means putting pressure on the affected area. °· Elevation. This means raising the affected area above the level of your heart. °Treatment may also include: °· Medicines for swelling or pain. °· Exercises or physical therapy. °· A brace or splint. °· Surgery. This is rarely needed. °Follow these instructions at home: °If you have a splint or brace: °· Wear the splint or brace as told by your doctor. Remove it only as told by your doctor. °· Loosen the splint or brace if your fingers or toes: °? Tingle. °? Become numb. °? Turn cold and blue. °· Keep the splint or brace clean. °· If the splint or brace is not waterproof: °? Do not let it get wet. °? Cover it with a watertight covering when you take a bath or shower. °Managing pain, stiffness, and swelling ° °  ° °· If told, put ice on the affected area. °? If you have a removable splint or brace, remove it as told by your doctor. °? Put ice in a plastic bag. °? Place a towel between your skin and the bag. °? Leave the ice on for 20 minutes, 2-3 times a day. °· Move the fingers or toes of the affected arm or leg often, if this applies. This helps to prevent stiffness and to lessen  swelling. °· If told, raise the affected area above the level of your heart while you are sitting or lying down. °· If told, put heat on the affected area before you exercise. Use the heat source that your doctor recommends, such as a moist heat pack or a heating pad. °? Place a towel between your skin and the heat source. °? Leave the heat on for 20-30 minutes. °? Remove the heat if your skin turns bright red. This is very important if you are unable to feel pain, heat, or cold. You may have a greater risk of getting burned. °Driving °· Do not drive or use heavy machinery while taking prescription pain medicine. °· Ask your doctor when it is safe to drive if you have a splint or brace on any part of your arm or leg. °Activity °· Rest the affected area as told by your doctor. °· Return to your normal activities as told by your doctor. Ask your doctor what activities are safe for you. °· Avoid using the affected area while you have symptoms. °· Do exercises as told by your doctor. °General instructions °· If you have a splint, do not put pressure on any part of the splint until it is fully hardened. This may take several hours. °· Wear an elastic bandage or pressure (compression) wrap only as told by your doctor. °· Take over-the-counter and prescription medicines only as   told by your doctor. °· Keep all follow-up visits as told by your doctor. This is important. °Contact a doctor if: °· You do not get better. °· You get new problems, such as numbness in your hands, and you do not know why. °Summary °· Tendinitis is swelling (inflammation) of a tendon. °· You are more likely to get this condition if you do activities that involve the same movements over and over again. °· This condition is usually treated with RICE therapy. RICE stands for rest, ice, compression, and elevate. °· Avoid using the affected area while you have symptoms. °This information is not intended to replace advice given to you by your health care  provider. Make sure you discuss any questions you have with your health care provider. °Document Revised: 06/17/2018 Document Reviewed: 04/30/2018 °Elsevier Patient Education © 2020 [employer] Inc[employer] ° °

## 2020-04-27 ENCOUNTER — Encounter: Payer: Self-pay | Admitting: Family Medicine

## 2020-08-22 ENCOUNTER — Ambulatory Visit: Payer: No Typology Code available for payment source | Admitting: Family Medicine

## 2021-01-20 ENCOUNTER — Ambulatory Visit: Payer: Self-pay | Admitting: Nurse Practitioner

## 2021-05-10 ENCOUNTER — Telehealth: Payer: Self-pay | Admitting: General Practice

## 2021-05-10 NOTE — Telephone Encounter (Signed)
Transition Care Management Follow-up Telephone Call  Date of discharge and from where: [facility] 05/09/21  How have you been since you were released from the hospital? Doing ok.  Any questions or concerns? No  Items Reviewed:  Did the pt receive and understand the discharge instructions provided? Yes   Medications obtained and verified? No   Other? No   Any new allergies since your discharge? No   Dietary orders reviewed? No  Do you have support at home? Yes   Home Care and Equipment/Supplies: Were home health services ordered? no   Functional Questionnaire: (I = Independent and D = Dependent) ADLs: I  Bathing/Dressing- I  Meal Prep- I  Eating- I  Maintaining continence- I  Transferring/Ambulation- I  Managing Meds- I  Follow up appointments reviewed:   PCP Hospital f/u appt confirmed? No  Patient stated he will call back to schedule.   [facility] f/u appt confirmed? No    Are transportation arrangements needed? No   If their condition worsens, is the pt aware to call PCP or go to the Emergency Dept.? Yes  Was the patient provided with contact information for the PCP's office or ED? Yes  Was to pt encouraged to call back with questions or concerns? Yes

## 2021-05-11 ENCOUNTER — Other Ambulatory Visit: Payer: Self-pay

## 2021-05-11 ENCOUNTER — Encounter: Payer: Self-pay | Admitting: Family Medicine

## 2021-05-11 ENCOUNTER — Ambulatory Visit (INDEPENDENT_AMBULATORY_CARE_PROVIDER_SITE_OTHER): Payer: PRIVATE HEALTH INSURANCE | Admitting: Family Medicine

## 2021-05-11 DIAGNOSIS — S63641A Sprain of metacarpophalangeal joint of right thumb, initial encounter: Secondary | ICD-10-CM

## 2021-05-11 DIAGNOSIS — S63649A Sprain of metacarpophalangeal joint of unspecified thumb, initial encounter: Secondary | ICD-10-CM | POA: Insufficient documentation

## 2021-05-11 NOTE — Assessment & Plan Note (Signed)
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  Letter completed and paperwork completed for return to work.  He may continue brace as needed.  Icing after work recommended.

## 2021-05-11 NOTE — Progress Notes (Signed)
Norman Green - 25 y.o. male MRN [id number]  Date of birth: 1996-06-29  Subjective Chief Complaint  Patient presents with  . Hospitalization Follow-up    HPI Norman Green is a 25 y.o. male here today for ED follow up .  He was seen in ED with complaint of R thumb pain.  Reports that his toddler jumped on his hand and he felt a pop with pain.  Had negative xrays in the ED and placed in thumb spica splint.  He was given note to remain out of work for a couple of days.  He needs note and paperwork completed by PCP.  Pain has improved but not fully resolved. He works Charity fundraiser.  He is planning on returning to work tomorrow.    ROS:  A comprehensive ROS was completed and negative except as noted per HPI  No Known Allergies  Past Medical History:  Diagnosis Date  . Closed Salter-Harris Type II physeal fracture of left distal radius 09/08/2012    No past surgical history on file.  Social History   Socioeconomic History  . Marital status: Single    Spouse name: Not on file  . Number of children: Not on file  . Years of education: Not on file  . Highest education level: Not on file  Occupational History  . Not on file  Tobacco Use  . Smoking status: Never Smoker  . Smokeless tobacco: Never Used  Substance and Sexual Activity  . Alcohol use: No  . Drug use: No  . Sexual activity: Never  Other Topics Concern  . Not on file  Social History Narrative  . Not on file   Social Determinants of Health   Financial Resource Strain: Not on file  Food Insecurity: Not on file  Transportation Needs: Not on file  Physical Activity: Not on file  Stress: Not on file  Social Connections: Not on file    No family history on file.  Health Maintenance  Topic Date Due  . HPV VACCINES (1 - Male 2-dose series) Never done  . Hepatitis C Screening  Never done  . COVID-19 Vaccine (3 - Booster for Pfizer series) 03/10/2021  . INFLUENZA VACCINE  07/24/2021  .  TETANUS/TDAP  04/25/2027  . HIV Screening  Completed     ----------------------------------------------------------------------------------------------------------------------------------------------------------------------------------------------------------------- Physical Exam BP (!) 137/120 (BP Location: Left Arm, Patient Position: Sitting, Cuff Size: Large)   Pulse (!) 103   Temp 98 F (36.7 C)   Wt 266 lb 14.4 oz (121.1 kg)   SpO2 99%   BMI 39.41 kg/m   Physical Exam Constitutional:      Appearance: Normal appearance.  Musculoskeletal:     Cervical back: Neck supple.  Skin:    Comments: Mild ttp along base of R thumb.  Normal grip strength without significant pain.    Neurological:     Mental Status: He is alert.     ------------------------------------------------------------------------------------------------------------------------------------------------------------------------------------------------------------------- Assessment and Plan  Sprain of metacarpophalangeal (MCP) joint of thumb He can return to work tomorrow.  Letter completed and paperwork completed for return to work.  He may continue brace as needed.  Icing after work recommended.     No orders of the defined types were placed in this encounter.   No follow-ups on file.    This visit occurred during the SARS-CoV-2 public health emergency.  Safety protocols were in place, including screening questions prior to the visit, additional usage of staff PPE, and extensive cleaning of exam room while  observing appropriate contact time as indicated for disinfecting solutions.

## 2021-10-02 ENCOUNTER — Telehealth: Payer: Self-pay

## 2021-10-02 NOTE — Telephone Encounter (Signed)
Transition Care Management Follow-up Telephone Call Date of discharge and from where: 09/30/2021 from [facility] How have you been since you were released from the hospital? Pt has appointment with Cardiology today.  Any questions or concerns? No

## 2021-11-03 ENCOUNTER — Telehealth: Payer: Self-pay | Admitting: General Practice

## 2021-11-03 NOTE — Telephone Encounter (Signed)
Transition Care Management Unsuccessful Follow-up Telephone Call  Date of discharge and from where:  11/03/21 from [facility]  Attempts:  1st Attempt  Reason for unsuccessful TCM follow-up call:  No answer/busy Patient left AMA from the hospital.

## 2021-11-06 NOTE — Telephone Encounter (Signed)
Transition Care Management Unsuccessful Follow-up Telephone Call  Date of discharge and from where:  11/03/21 from [facility]  Attempts:  2nd Attempt  Reason for unsuccessful TCM follow-up call:  No answer/busy

## 2021-11-08 NOTE — Telephone Encounter (Signed)
Transition Care Management Unsuccessful Follow-up Telephone Call  Date of discharge and from where:  11/03/21 from [facility]  Attempts:  3rd Attempt  Reason for unsuccessful TCM follow-up call:  No answer/busy

## 2021-12-06 ENCOUNTER — Ambulatory Visit (INDEPENDENT_AMBULATORY_CARE_PROVIDER_SITE_OTHER): Payer: Self-pay | Admitting: Sports Medicine

## 2021-12-06 ENCOUNTER — Other Ambulatory Visit: Payer: Self-pay

## 2021-12-06 DIAGNOSIS — Z23 Encounter for immunization: Secondary | ICD-10-CM

## 2021-12-06 DIAGNOSIS — R030 Elevated blood-pressure reading, without diagnosis of hypertension: Secondary | ICD-10-CM

## 2021-12-06 DIAGNOSIS — Z0000 Encounter for general adult medical examination without abnormal findings: Secondary | ICD-10-CM

## 2021-12-06 NOTE — Assessment & Plan Note (Signed)
I have not seen Norman Green in some time, he cut himself on a dirty blade, he was up-to-date on tetanus vaccination, we gave him a new one today. Return in a year for physical.

## 2021-12-06 NOTE — Addendum Note (Signed)
Addended by: Gust Brooms on: 12/06/2021 04:25 PM   Modules accepted: Orders

## 2021-12-06 NOTE — Progress Notes (Signed)
° ° °  Procedures performed today:    None.  Independent interpretation of notes and tests performed by another provider:   None.  Brief History, Exam, Impression, and Recommendations:    Annual physical exam I have not seen Trevian in some time, he cut himself on a dirty blade, he was up-to-date on tetanus vaccination, we gave him a new one today. Return in a year for physical.    ___________________________________________ Gwen Her. Dianah Field, M.D., ABFM., CAQSM. Primary Care and [place] Instructor of [place] [facility]

## 2021-12-11 ENCOUNTER — Telehealth: Payer: Self-pay | Admitting: General Practice

## 2021-12-11 NOTE — Telephone Encounter (Addendum)
Transition Care Management Unsuccessful Follow-up Telephone Call  Date of discharge and from where:  12/08/21 from [facility]  Attempts:  1st Attempt  Reason for unsuccessful TCM follow-up call:  Voicemail full

## 2021-12-13 NOTE — Telephone Encounter (Signed)
Transition Care Management Unsuccessful Follow-up Telephone Call  Date of discharge and from where:  12/08/21 from [facility]  Attempts:  2nd Attempt  Reason for unsuccessful TCM follow-up call:  Voice mail full

## 2021-12-15 NOTE — Telephone Encounter (Signed)
Transition Care Management Unsuccessful Follow-up Telephone Call  Date of discharge and from where:  12/08/21 from [facility]  Attempts:  3rd Attempt  Reason for unsuccessful TCM follow-up call:  Voice mail full

## 2021-12-27 ENCOUNTER — Telehealth: Payer: Self-pay | Admitting: General Practice

## 2021-12-27 NOTE — Telephone Encounter (Signed)
Transition Care Management Unsuccessful Follow-up Telephone Call  Date of discharge and from where:  12/26/21 from [facility]  Attempts:  1st Attempt  Reason for unsuccessful TCM follow-up call:  Voice mail full needs hospital follow up within 7 days.

## 2021-12-29 NOTE — Telephone Encounter (Signed)
Transition Care Management Unsuccessful Follow-up Telephone Call  Date of discharge and from where:  12/26/21 from [facility]  Attempts:  2nd Attempt  Reason for unsuccessful TCM follow-up call:  Voice mail full needs hospital follow up within 7 days.

## 2022-01-01 NOTE — Telephone Encounter (Signed)
Transition Care Management Unsuccessful Follow-up Telephone Call  Date of discharge and from where:  12/26/21 from [facility]  Attempts:  3rd Attempt  Reason for unsuccessful TCM follow-up call:  Voice mail full Patient needed a hospital follow up with in 7 days of discharge.

## 2022-06-04 ENCOUNTER — Telehealth: Payer: Self-pay | Admitting: General Practice

## 2022-06-04 NOTE — Telephone Encounter (Signed)
Transition Care Management Unsuccessful Follow-up Telephone Call  Date of discharge and from where:  05/30/22 from [facility]   Attempts:  1st Attempt  Reason for unsuccessful TCM follow-up call:  Left voice message

## 2022-06-05 NOTE — Telephone Encounter (Signed)
Transition Care Management Unsuccessful Follow-up Telephone Call  Date of discharge and from where:  05/30/22 from [facility]  Attempts:  2nd Attempt  Reason for unsuccessful TCM follow-up call:  Left voice message

## 2022-06-11 NOTE — Telephone Encounter (Signed)
Transition Care Management Unsuccessful Follow-up Telephone Call  Date of discharge and from where:  05/30/22 from [facility]  Attempts:  3rd Attempt  Reason for unsuccessful TCM follow-up call:  No answer/busy

## 2022-09-14 ENCOUNTER — Encounter: Payer: Self-pay | Admitting: Family Medicine

## 2022-09-14 ENCOUNTER — Ambulatory Visit (INDEPENDENT_AMBULATORY_CARE_PROVIDER_SITE_OTHER): Payer: Self-pay | Admitting: Family Medicine

## 2022-09-14 VITALS — BP 152/88 | HR 102 | Temp 97.8°F | Ht 69.0 in | Wt 238.0 lb

## 2022-09-14 DIAGNOSIS — R030 Elevated blood-pressure reading, without diagnosis of hypertension: Secondary | ICD-10-CM

## 2022-09-14 DIAGNOSIS — H9201 Otalgia, right ear: Secondary | ICD-10-CM

## 2022-09-14 NOTE — Patient Instructions (Signed)
Recommend a trial of Flonase, Nasonex or Rhinocort.  Recommend 2 sprays in each nostril daily for the next week.  If you are not feeling some better by Tuesday then please let us know.

## 2022-09-14 NOTE — Progress Notes (Signed)
   Acute Office Visit  Subjective:     Patient ID: Norman Green, male    DOB: 1996-06-23, 26 y.o.   MRN: [id number]  Chief Complaint  Patient presents with   Ear Pain    HPI Patient is in today for ear pain.  He says early Tuesday morning he felt like a bug got in his ear.  He tried to rinse it out initially and then got a Q-tip he said he poked his eardrum a couple of times.  Since then it is just felt full and has had some intermittent pain.  So he wanted to make sure that everything was okay.  No sore throat.  He has been using some over-the-counter swimmer's ear drops as well he says it just feels muffled.  ROS      Objective:    BP (!) 152/88   Pulse (!) 102   Temp 97.8 F (36.6 C)   Ht '5\' 9"'$  (1.753 m)   Wt 238 lb (108 kg)   SpO2 97%   BMI 35.15 kg/m    Physical Exam Constitutional:      Appearance: Normal appearance.  HENT:     Head:     Comments: TM and canal is clear.  Right canal is clear and the TM on the right side is mildly retracted but no significant erythema and no tears.    Right Ear: External ear normal.     Left Ear: External ear normal.     Mouth/Throat:     Pharynx: Oropharynx is clear.  Eyes:     Conjunctiva/sclera: Conjunctivae normal.     No results found for any visits on 09/14/22.      Assessment & Plan:   Problem List Items Addressed This Visit   None Visit Diagnoses     Right ear pain    -  Primary   Elevated BP without diagnosis of hypertension          Right ear pain-fortunately there is no sign of significant trauma to the tympanic membrane which is reassuring.  Avoid any drops at this point there is no sign of infection and do not use the Q-tips.  Recommend nasal spray such as fluticasone for the muffled feeling there is a little bit of retraction.  If not feeling better after the weekend then please let us know.  Elevated BP - return in 2 weeks for BP recheck.    No orders of the defined types were placed in this  encounter.   No follow-ups on file.  Beatrice Lecher, MD

## 2022-09-28 ENCOUNTER — Ambulatory Visit: Payer: Self-pay

## 2023-05-14 ENCOUNTER — Telehealth: Payer: Self-pay | Admitting: General Practice

## 2023-05-14 NOTE — Transitions of Care (Post Inpatient/ED Visit) (Signed)
   05/14/2023  Name: Norman Green MRN: [id number] DOB: 1996/01/11  Today's TOC FU Call Status: Today's TOC FU Call Status:: Unsuccessul Call (1st Attempt) Unsuccessful Call (1st Attempt) Date: 05/14/23  Attempted to reach the patient regarding the most recent Inpatient/ED visit.  Follow Up Plan: Additional outreach attempts will be made to reach the patient to complete the Transitions of Care (Post Inpatient/ED visit) call.   Signature [place] Charon, RN BSN

## 2023-05-15 NOTE — Transitions of Care (Post Inpatient/ED Visit) (Signed)
   05/15/2023  Name: Norman Green MRN: [id number] DOB: 1996-05-07  Today's TOC FU Call Status: Today's TOC FU Call Status:: Unsuccessful Call (2nd Attempt) Unsuccessful Call (1st Attempt) Date: 05/14/23 Unsuccessful Call (2nd Attempt) Date: 05/15/23  Attempted to reach the patient regarding the most recent Inpatient/ED visit.  Follow Up Plan: Additional outreach attempts will be made to reach the patient to complete the Transitions of Care (Post Inpatient/ED visit) call.   Signature [place] Charon, [place] 05/15/2023 9:14 AM

## 2023-05-21 NOTE — Transitions of Care (Post Inpatient/ED Visit) (Signed)
   05/21/2023  Name: Norman Green MRN: [id number] DOB: 10-07-1996  Today's TOC FU Call Status: Today's TOC FU Call Status:: Unsuccessful Call (3rd Attempt) Unsuccessful Call (1st Attempt) Date: 05/14/23 Unsuccessful Call (2nd Attempt) Date: 05/15/23 Unsuccessful Call (3rd Attempt) Date: 05/21/23  Attempted to reach the patient regarding the most recent Inpatient/ED visit.  Follow Up Plan: No further outreach attempts will be made at this time. We have been unable to contact the patient.  Signature [place] Charon, RN BSN

## 2023-07-26 ENCOUNTER — Encounter: Payer: Self-pay | Admitting: Medical-Surgical

## 2023-07-26 ENCOUNTER — Telehealth (INDEPENDENT_AMBULATORY_CARE_PROVIDER_SITE_OTHER): Payer: Self-pay | Admitting: Medical-Surgical

## 2023-07-26 VITALS — Ht 69.0 in | Wt 240.0 lb

## 2023-07-26 DIAGNOSIS — J309 Allergic rhinitis, unspecified: Secondary | ICD-10-CM

## 2023-07-26 MED ORDER — LEVOCETIRIZINE DIHYDROCHLORIDE 5 MG PO TABS: 5.0000 mg | ORAL_TABLET | Freq: Every evening | ORAL | 1 refills | Status: AC

## 2023-07-26 MED ORDER — FLUTICASONE PROPIONATE 50 MCG/ACT NA SUSP: 2.0000 | Freq: Every day | NASAL | 6 refills | Status: AC

## 2023-07-26 MED ORDER — PSEUDOEPHEDRINE HCL ER 120 MG PO TB12: 120.0000 mg | ORAL_TABLET | Freq: Two times a day (BID) | ORAL | 0 refills | Status: AC | PRN

## 2023-07-26 NOTE — Progress Notes (Signed)
Virtual Visit via Video Note  I connected with Norman Green on 07/26/23 at  1:00 PM EDT by a video enabled telemedicine application and verified that I am speaking with the correct person using two identifiers.   I discussed the limitations of evaluation and management by telemedicine and the availability of in person appointments. The patient expressed understanding and agreed to proceed.  Patient location: home Provider locations: office  Subjective:    CC: Sinus congestion  HPI: Pleasant 27 year old male presenting via MyChart video visit with reports of sinus congestion that started yesterday afternoon.  This mostly affects the left nostril and he has developed some facial pain along the left maxillary region.  Also having a headache that affects the left forehead.  No fever, chills, cough, shortness of breath, or sore throat.  Endorses a scratchy throat in the middle of the night however this has resolved.  Has had some sneezing and rhinorrhea.  No known sick contacts.  Reports that over the last year or so, his allergies have gotten much worse than they previously were.  Takes an allergy medication year-round but is unsure what medication this is.  Not using any nasal sprays at this time.  Has not tried any other over-the-counter medications.   Past medical history, Surgical history, Family history not pertinant except as noted below, Social history, Allergies, and medications have been entered into the medical record, reviewed, and corrections made.   Review of Systems: See HPI for pertinent positives and negatives.   Objective:    General: Speaking clearly in complete sentences without any shortness of breath.  Alert and oriented x3.  Normal judgment. No apparent acute distress.  Impression and Recommendations:    1. Allergic rhinitis, unspecified seasonality, unspecified trigger With only 24 to 36 hours of symptoms and worsening of allergies over the last year, suspect this is  simply related to allergic rhinitis.  Switching his daily antihistamine to Xyzal 5 mg.  Adding Flonase 2 sprays each nostril daily.  Okay to use Sudafed twice daily as needed x 5 days.  Advised to reach out to [place] in approximately 1 week if symptoms have worsened or fail to improve.  Referring to allergy for complete allergy testing at patient request. - fluticasone (FLONASE) 50 MCG/ACT nasal spray; Place 2 sprays into both nostrils daily.  Dispense: 16 g; Refill: 6 - pseudoephedrine (SUDAFED) 120 MG 12 hr tablet; Take 1 tablet (120 mg total) by mouth every 12 (twelve) hours as needed for up to 5 days for congestion.  Dispense: 10 tablet; Refill: 0 - levocetirizine (XYZAL) 5 MG tablet; Take 1 tablet (5 mg total) by mouth every evening.  Dispense: 30 tablet; Refill: 1 - Ambulatory referral to Allergy   I discussed the assessment and treatment plan with the patient. The patient was provided an opportunity to ask questions and all were answered. The patient agreed with the plan and demonstrated an understanding of the instructions.   The patient was advised to call back or seek an in-person evaluation if the symptoms worsen or if the condition fails to improve as anticipated.  20 minutes of non-face-to-face time was provided during this encounter.  Return if symptoms worsen or fail to improve.  Thayer Ohm, DNP, APRN, FNP-BC [facility]

## 2024-06-12 ENCOUNTER — Ambulatory Visit: Payer: Self-pay | Admitting: Urgent Care

## 2024-06-12 ENCOUNTER — Encounter: Payer: Self-pay | Admitting: Urgent Care

## 2024-06-12 VITALS — BP 127/69 | HR 77 | Resp 18 | Ht 69.0 in | Wt 228.8 lb

## 2024-06-12 DIAGNOSIS — M778 Other enthesopathies, not elsewhere classified: Secondary | ICD-10-CM

## 2024-06-12 MED ORDER — DICLOFENAC SODIUM 75 MG PO TBEC
75.0000 mg | DELAYED_RELEASE_TABLET | Freq: Two times a day (BID) | ORAL | 0 refills | Status: AC
Start: 2024-06-12 — End: ?

## 2024-06-12 NOTE — Progress Notes (Signed)
 Established Patient Office Visit  Subjective:  Patient ID: Norman Green, male    DOB: 27-Dec-1995  Age: 28 y.o. MRN: [id number]  Chief Complaint  Patient presents with   Hand Pain    Painful and swollen since tues. No recall of an injury    HPI   Discussed the use of AI scribe software for clinical note transcription with the patient, who gave verbal consent to proceed.  History of Present Illness   Norman Green is a 28 year old male who presents with sharp pain in the right hand.  He has been experiencing sharp pain in the right hand since Tuesday after returning home from work. The pain is localized to the outside portion of the fourth finger, specifically around the PIP joint, and extends towards the hand but does not reach the wrist. It is described as sharp and is rated as an eight out of ten when severe. The pain is absent at rest but worsens with movement, such as grabbing objects.  There is no history of trauma, redness, swelling, or changes in skin color. No tingling or loss of sensation in the affected finger. He has not experienced any issues with grabbing, lifting, or pushing. The pain does not occur during sleep, but he feels 'super frizzed' upon waking.  For management, he has attempted to use ice and elevate the hand during sleep. He has not taken any medications for the pain. 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      Patient Active Problem List   Diagnosis Date Noted   Elevated blood pressure reading 01/27/2019   Annual physical exam 12/03/2016   Past Medical History:  Diagnosis Date   Closed Salter-Harris Type II physeal fracture of left distal radius 09/08/2012   History reviewed. No pertinent surgical history. Social History   Tobacco Use   Smoking status: Never   Smokeless tobacco: Never  Substance Use Topics   Alcohol use: No   Drug use: No      ROS: as noted in HPI  Objective:     BP 127/69 (BP Location: Left Arm,  Patient Position: Sitting, Cuff Size: Large)   Pulse 77   Resp 18   Ht 5' 9 (1.753 m)   Wt 228 lb 12 oz (103.8 kg)   SpO2 99%   BMI 33.78 kg/m  BP Readings from Last 3 Encounters:  06/12/24 127/69  09/14/22 (!) 152/88  05/11/21 (!) 137/120   Wt Readings from Last 3 Encounters:  06/12/24 228 lb 12 oz (103.8 kg)  07/26/23 240 lb (108.9 kg)  09/14/22 238 lb (108 kg)      Physical Exam Vitals and nursing note reviewed.  Constitutional:      General: He is not in acute distress.    Appearance: Normal appearance. He is normal weight. He is not ill-appearing, toxic-appearing or diaphoretic.  HENT:     Head: Normocephalic and atraumatic.   Cardiovascular:     Rate and Rhythm: Normal rate.     Pulses: Normal pulses.  Pulmonary:     Effort: Pulmonary effort is normal. No respiratory distress.   Musculoskeletal:        General: Tenderness (reproducible tenderness to the extensor tendon of 4th digit R hand) present. No swelling, deformity or signs of injury. Normal range of motion.       Hands:   Skin:    General: Skin is warm and dry.     Capillary Refill: Capillary refill takes  less than 2 seconds.     Coloration: Skin is not jaundiced.     Findings: No bruising, erythema or rash.   Neurological:     General: No focal deficit present.     Mental Status: He is alert and oriented to person, place, and time.     Sensory: No sensory deficit.     Motor: No weakness.   Psychiatric:        Mood and Affect: Mood normal.        Behavior: Behavior normal.      No results found for any visits on 06/12/24.  Last CBC Lab Results  Component Value Date   WBC 7.3 12/03/2016   HGB 15.8 12/03/2016   HCT 45.9 12/03/2016   MCV 87.4 12/03/2016   MCH 30.1 12/03/2016   RDW 13.8 12/03/2016   PLT 229 12/03/2016   Last metabolic panel Lab Results  Component Value Date   GLUCOSE 105 (H) 04/13/2020   NA 140 04/13/2020   K 4.1 04/13/2020   CL 104 04/13/2020   CO2 27 04/13/2020    BUN 13 04/13/2020   CREATININE 0.86 04/13/2020   GFRNONAA 123 04/13/2020   CALCIUM 10.3 04/13/2020   PROT 7.6 04/13/2020   ALBUMIN 4.8 12/03/2016   BILITOT 0.5 04/13/2020   ALKPHOS 71 12/03/2016   AST 18 04/13/2020   ALT 16 04/13/2020      The ASCVD Risk score (Arnett DK, et al., 2019) failed to calculate for the following reasons:   The 2019 ASCVD risk score is only valid for ages 46 to 14  Assessment & Plan:  Tendonitis of finger -     Diclofenac  Sodium; Take 1 tablet (75 mg total) by mouth 2 (two) times daily with a meal.  Dispense: 28 tablet; Refill: 0 -     Finger splint  Assessment and Plan    Tendonitis of the right fourth finger Sharp pain in the dorsal right fourth finger, likely from occupational overuse. No signs of fracture or infection. X-ray not indicated given presentation and lack of trauma. - Finger brace applied to 4th digit, recommended consistent use. - Recommend immobilization for 1-2 weeks. - Prescribe scheduled anti-inflammatory medication, diclofenac  BID - return for recheck in 2 weeks if sx persist, worsen or change.        No follow-ups on file.   Benton LITTIE Gave, PA

## 2024-06-12 NOTE — Patient Instructions (Signed)
 Please wear the finger splint daily for 1-2 weeks. Please take the diclofenac twice daily with food for 1-2 weeks.  Read the attached handout. Return if symptoms persist or worsen.

## 2024-08-25 ENCOUNTER — Encounter: Payer: Self-pay | Admitting: Sports Medicine

## 2024-08-31 ENCOUNTER — Ambulatory Visit: Payer: Self-pay

## 2024-08-31 NOTE — Progress Notes (Unsigned)
   Acute Office Visit  Subjective:     Patient ID: Norman Green, male    DOB: 02-25-1996, 28 y.o.   MRN: [id number]  No chief complaint on file.   HPI Patient is in today for acute ankle injury.  .[email or web address]  ROS  See HPI    Objective:    There were no vitals taken for this visit. {Vitals History (Optional):23777}  Physical Exam  Ankle: No visible erythema or swelling. Range of motion is full in all directions. Strength is 5/5 in all directions. Stable lateral and medial ligaments No pain at base of 5th MT; No tenderness over cuboid; No tenderness over N spot or navicular prominence No tenderness on posterior aspects of lateral and medial malleolus No sign of peroneal tendon subluxations or tenderness to palpation Negative tarsal tunnel tinel's Able to walk 4 steps.     No results found for any visits on 09/01/24.      Assessment & Plan:   Problem List Items Addressed This Visit   None   Recommend conservative management for ankle pain, including RICE (rest, ice, compression, elevation) and use of over-the-counter Tylenol  or Ibuprofen  as needed for pain relief. Monitor symptoms and return to clinic if pain worsens or persists.    No orders of the defined types were placed in this encounter.   No follow-ups on file.  Norman Green L Malaki Koury, NP

## 2024-08-31 NOTE — Telephone Encounter (Signed)
 FYI Only or Action Required?: FYI only for provider.  Patient was last seen in primary care on 06/12/2024 by Lowella Benton CROME, PA.  Called Nurse Triage reporting Ankle Pain.  Symptoms began several weeks ago.  Interventions attempted: Ice/heat application.  Symptoms are: gradually worsening.  Triage Disposition: No disposition on file.  Patient/caregiver understands and will follow disposition?:  Reason for Disposition  [1] MODERATE pain (e.g., interferes with normal activities, limping) AND [2] present > 3 days  Answer Assessment - Initial Assessment Questions States cleat got caught when playing soccer 3 weeks ago and ankle was overextended. States that he re-injured it when walking last week. No improvement with OTC brace.  1. ONSET: When did the pain start?      3 weeks 2. LOCATION: Where is the pain located?      Right ankle 3. PAIN: How bad is the pain?  (Scale 1-10; or mild, moderate, severe)     7-8/10  Protocols used: Ankle Pain-A-AH Copied from CRM [id number]. Topic: Clinical - Red Word Triage >> Aug 31, 2024  1:02 PM Corin V wrote: [facility] that prompted transfer to Nurse Triage: Patient thinks he may have sprained his issue about 3 weeks ago. He did not seek treatment thinking it was just a sprain and would heal on it own. He has been wearing an ankle brace but pain is still severe

## 2024-08-31 NOTE — Telephone Encounter (Signed)
 Patient scheduled.

## 2024-09-01 ENCOUNTER — Ambulatory Visit: Payer: Self-pay | Admitting: Student

## 2024-09-01 ENCOUNTER — Encounter: Payer: Self-pay | Admitting: *Deleted

## 2024-09-01 NOTE — Progress Notes (Unsigned)
 Error.  Pt no show

## 2024-09-04 ENCOUNTER — Encounter: Payer: Self-pay | Admitting: Student

## 2024-09-04 ENCOUNTER — Ambulatory Visit: Payer: Self-pay | Admitting: Student

## 2024-09-04 VITALS — BP 143/60 | HR 83 | Ht 69.0 in | Wt 229.4 lb

## 2024-09-04 DIAGNOSIS — S93401A Sprain of unspecified ligament of right ankle, initial encounter: Secondary | ICD-10-CM | POA: Insufficient documentation

## 2024-09-04 DIAGNOSIS — S93401S Sprain of unspecified ligament of right ankle, sequela: Secondary | ICD-10-CM

## 2024-09-04 NOTE — Progress Notes (Signed)
   Acute Office Visit  Subjective:     Patient ID: Norman Green, male    DOB: 1996-04-15, 28 y.o.   MRN: [id number]  No chief complaint on file.   HPI  History of Present Illness Norman Green is a 28 year old male who presents with right ankle pain.  Ankle pain began four weeks ago after tripping while playing soccer, causing the ankle to bend back. Pain is localized near the Achilles tendon but does not involve the tendon itself. A recurrence of pain occurred after stepping awkwardly a week and a half later, with pain persisting and flaring up with certain movements. Slipping on steps last weekend significantly exacerbated the pain, leading to an ER visit where X-rays showed no fractures.  He can bear weight and walk, although certain movements cause significant pain. Pain is localized between the bone and the Achilles tendon and initially involved the heel. No popping or snapping sensation was noted at the time of injury.He works as a Retail banker for [employer], which involves frequent standing. He has previous ankle sprains.    Patient denies fever, chills, SOB, CP, palpitations, dyspnea, edema, HA, vision changes, N/V/D, abdominal pain, urinary symptoms, rash, weight changes, and recent illness or hospitalizations.   ROS See HPI     Objective:    BP (!) 143/60   Pulse 83   Ht 5' 9 (1.753 m)   Wt 229 lb 6.4 oz (104.1 kg)   BMI 33.88 kg/m    Physical Exam  General: No acute distress. Awake and conversant.  Respiratory: CTAB. Respirations are non-labored. No wheezing.  Skin: Warm. No rashes or ulcers.  Psych: Alert and oriented. Cooperative, Appropriate mood and affect, Normal judgment.  CV: RRR. No murmur. No lower extremity edema.  MSK: Normal ambulation. No clubbing or cyanosis.  Neuro:  CN II-XII grossly normal.   Ankle: No visible erythema or swelling. Range of motion is full in all directions. Strength is 5/5 in all directions. Stable lateral and  medial ligaments; No pain at base of 5th MT; No tenderness over cuboid; No tenderness over N spot or navicular prominence No tenderness on posterior aspects of lateral and medial malleolus No sign of peroneal tendon subluxations or tenderness to palpation Able to walk 4 steps.       No results found for any visits on 09/04/24.      Assessment & Plan:   Problem List Items Addressed This Visit     Sprain of right ankle - Primary    No fractures on x-ray from UC, per Pt. Full range of motion and able to bear weight.  - Continue ankle brace. - Avoid sports, rest ankle. - Use OTC analgesics for discomfort. - Rest, ice, elevate ankle after prolonged standing. Continue using ankle brace and avoid strenuous activity. Recommend conservative management for ankle pain, including RICE (rest, ice, compression, elevation) and use of over-the-counter Tylenol  or Ibuprofen  as needed for pain relief. Monitor symptoms and return to clinic if pain worsens or persists.   No orders of the defined types were placed in this encounter.   No follow-ups on file.  Tejal Monroy L Javonni Macke, NP

## 2025-01-14 ENCOUNTER — Encounter (HOSPITAL_BASED_OUTPATIENT_CLINIC_OR_DEPARTMENT_OTHER): Payer: Self-pay | Admitting: Family Medicine

## 2025-01-14 ENCOUNTER — Ambulatory Visit (HOSPITAL_BASED_OUTPATIENT_CLINIC_OR_DEPARTMENT_OTHER): Payer: Self-pay | Admitting: Family Medicine

## 2025-01-14 VITALS — BP 116/65 | HR 118 | Temp 98.3°F | Resp 20 | Ht 69.0 in | Wt 225.0 lb

## 2025-01-14 DIAGNOSIS — J069 Acute upper respiratory infection, unspecified: Secondary | ICD-10-CM

## 2025-01-14 LAB — POC COVID19/FLU A&B COMBO
Covid Antigen, POC: NEGATIVE
Influenza A Antigen, POC: NEGATIVE
Influenza B Antigen, POC: NEGATIVE

## 2025-01-14 NOTE — Patient Instructions (Addendum)
 Stay home and away from others until symptoms are improving and fever free for 24 hours without the use of tylenol  or ibuprofen . If symptoms have improved and fever resolved, then can be in public setting, but should wear a mask around others, practice safe distancing, and perform hand hygiene for an additional 5 days. If symptoms worsen, or the patient develop shortness of breath, pulse oximeter reading of < 90%, or chest pain, seek immediate care at nearest emergency department or call 911.   If uncontrolled fever, vomiting or diarrhea occurs, go to ER.   Vitamin Regimen:  Vitamin C 500mg  twice daily  Vitamin D  5000 units once daily  Zinc 50-75mg  once daily   Over the counter Medications:  Use Tylenol  (acetaminophen ) for fever *unless allergic or contraindicated* Alternate Tylenol  and Ibuprofen  for fever control.    Non-Medication Therapy:  Drink plenty of fluids, warm if possible.   A teaspoon of honey may help ease coughing symptoms.   Cough drops or hard candy for coughing.   Over the Counter Medication Therapy:  Use a cough expectorant such as guaifenesin (Mucinex) if recommended by your doctor for a wet, congested cough. If you have high blood pressure, please ask your doctor first before using this.   Use a cough suppressant such as dextromethorphan (Robitussin/Delsym) for a dry cough. If you have high blood pressure, please ask your doctor first before using this.   If you have high blood pressure, medication such as Coricidin HBP is safe to take for your cough and will not increase your blood pressure.

## 2025-01-14 NOTE — Progress Notes (Signed)
 "     Acute Care Office Visit  Subjective:   Norman Green 03-25-1996 01/14/2025  Chief Complaint  Patient presents with   URI   Cough   Nasal Congestion   Chills   Generalized Body Aches    HPI: Patient reports woke up this morning with fever, bodyaches and coughing. Denies known exposure to illness but works for [employer] and frequently around many people. Denies nausea, vomiting or diarrhea. He denies shortness of breath, chest pain, wheezing, or urinary symptoms. He did start coughing last night and has taken OTC cough syrup and Advil . He is currently taking Amoxicillin for a injury to his cheek and has several days left of treatment. Patient reports fever of 101.6 at 1PM today and took ibuprofen  at that time which fever has responded to.    The following portions of the patient's history were reviewed and updated as appropriate: past medical history, past surgical history, family history, social history, allergies, medications, and problem list.   Patient Active Problem List   Diagnosis Date Noted   Sprain of right ankle 09/04/2024   Elevated blood pressure reading 01/27/2019   Annual physical exam 12/03/2016   Past Medical History:  Diagnosis Date   Closed Salter-Harris Type II physeal fracture of left distal radius 09/08/2012   History reviewed. No pertinent surgical history. History reviewed. No pertinent family history. Outpatient Medications Prior to Visit  Medication Sig Dispense Refill   diclofenac  (VOLTAREN ) 75 MG EC tablet Take 1 tablet (75 mg total) by mouth 2 (two) times daily with a meal. (Patient not taking: Reported on 01/14/2025) 28 tablet 0   fluticasone  (FLONASE ) 50 MCG/ACT nasal spray Place 2 sprays into both nostrils daily. (Patient not taking: Reported on 01/14/2025) 16 g 6   levocetirizine (XYZAL ) 5 MG tablet Take 1 tablet (5 mg total) by mouth every evening. (Patient not taking: Reported on 01/14/2025) 30 tablet 1   No facility-administered  medications prior to visit.   Allergies[1]   ROS: A complete ROS was performed with pertinent positives/negatives noted in the HPI. The remainder of the ROS are negative.    Objective:   Today's Vitals   01/14/25 1426 01/14/25 1452  BP: 116/65   Pulse: (!) 125 (!) 118  Resp: 20   Temp: 98.3 F (36.8 C)   TempSrc: Oral   SpO2: 95%   Weight: 225 lb (102.1 kg)   Height: 5' 9 (1.753 m)   PainSc: 7    PainLoc: Generalized     GENERAL: Mildly ill- appearing, in NAD. Well nourished.  SKIN: Pink, warm and dry. No rash, lesion, ulceration, or ecchymoses.  Head: Normocephalic. NECK: Trachea midline. Full ROM w/o pain or tenderness. No lymphadenopathy.  EARS: Tympanic membranes are intact, translucent without bulging and without drainage. Appropriate landmarks visualized.  EYES: Conjunctiva clear without exudates. EOMI, PERRL, no drainage present.  NOSE: Septum midline w/o deformity. Nares patent, mucosa pink and non-inflamed w/o drainage. No sinus tenderness.  THROAT: Uvula midline. Oropharynx clear. Tonsils non-inflamed without exudate. Mucous membranes pink and moist. Small area indentation in stage of healing without redness , drainage or signs of infection to left buccal area.  RESPIRATORY: Chest wall symmetrical. Respirations even and non-labored. Breath sounds clear to auscultation bilaterally. Cough congested, non productive.  CARDIAC: S1, S2 present, tachycardic rate and rhythm without murmur or gallops. Peripheral pulses 2+ bilaterally.  MSK: Muscle tone and strength appropriate for age.  NEUROLOGIC: No motor or sensory deficits. Steady, even gait. C2-C12 intact.  PSYCH/MENTAL STATUS: Alert, oriented x 3. Cooperative, appropriate mood and affect.    Results for orders placed or performed in visit on 01/14/25  POC Covid19/Flu A&B Antigen  Result Value Ref Range   Influenza A Antigen, POC Negative Negative   Influenza B Antigen, POC Negative Negative   Covid Antigen, POC  Negative Negative      Assessment & Plan:  1. Viral upper respiratory tract infection (Primary) Flu and Covid negative in office. Discussed given early testing with onset of symptoms today, viral shedding may be too early for positive result. Discussed symptom management with fever control, increased clear fluids, cough control, and red flag signs/symptoms to look for. Work note provided. Discussed retesting with home kit and antivirals, patient declined.  - POC Covid19/Flu A&B Antigen   No orders of the defined types were placed in this encounter.  Lab Orders         POC Covid19/Flu A&B Antigen     Return if symptoms worsen or fail to improve.    Patient to reach out to office if new, worrisome, or unresolved symptoms arise or if no improvement in patient's condition. Patient verbalized understanding and is agreeable to treatment plan. All questions answered to patient's satisfaction.    Thersia Schuyler Stark, FNP      [1] No Known Allergies  "
